# Patient Record
Sex: Male | Born: 1965
Health system: Southern US, Community
[De-identification: ages and names within clinical notes are randomized; demographics above are authoritative.]

## PROBLEM LIST (undated history)

## (undated) DIAGNOSIS — G510 Bell's palsy: Secondary | ICD-10-CM

---

## 2006-10-09 ENCOUNTER — Emergency Department (HOSPITAL_COMMUNITY): Admission: EM | Admit: 2006-10-09 | Discharge: 2006-10-09 | Payer: Self-pay | Admitting: Emergency Medicine

## 2006-12-02 ENCOUNTER — Emergency Department (HOSPITAL_COMMUNITY): Admission: EM | Admit: 2006-12-02 | Discharge: 2006-12-02 | Payer: Self-pay | Admitting: Emergency Medicine

## 2006-12-07 ENCOUNTER — Emergency Department (HOSPITAL_COMMUNITY): Admission: EM | Admit: 2006-12-07 | Discharge: 2006-12-07 | Payer: Self-pay | Admitting: Emergency Medicine

## 2006-12-20 ENCOUNTER — Emergency Department (HOSPITAL_COMMUNITY): Admission: EM | Admit: 2006-12-20 | Discharge: 2006-12-20 | Payer: Self-pay | Admitting: Emergency Medicine
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
## 2008-11-20 ENCOUNTER — Emergency Department (HOSPITAL_COMMUNITY): Admission: EM | Admit: 2008-11-20 | Discharge: 2008-11-20 | Payer: Self-pay | Admitting: Emergency Medicine

## 2010-06-13 LAB — GLUCOSE, CAPILLARY: Glucose-Capillary: 98 mg/dL (ref 70–99)

## 2018-07-05 ENCOUNTER — Encounter (HOSPITAL_COMMUNITY): Payer: Self-pay | Admitting: Emergency Medicine

## 2018-07-05 ENCOUNTER — Emergency Department (HOSPITAL_COMMUNITY)
Admission: EM | Admit: 2018-07-05 | Discharge: 2018-07-05 | Disposition: A | Discharge status: Home / Self Care | Payer: Medicare (Managed Care) | Attending: Emergency Medicine | Admitting: Emergency Medicine

## 2018-07-05 DIAGNOSIS — F17200 Nicotine dependence, unspecified, uncomplicated: Secondary | ICD-10-CM | POA: Diagnosis not present

## 2018-07-05 DIAGNOSIS — I10 Essential (primary) hypertension: Secondary | ICD-10-CM

## 2018-07-05 DIAGNOSIS — G510 Bell's palsy: Secondary | ICD-10-CM

## 2018-07-05 DIAGNOSIS — R29810 Facial weakness: Secondary | ICD-10-CM | POA: Diagnosis present

## 2018-07-05 HISTORY — DX: Bell's palsy: G51.0

## 2018-07-05 MED ORDER — PREDNISONE 20 MG PO TABS: 60.0000 mg | ORAL_TABLET | Freq: Every day | ORAL | 0 refills | Status: AC

## 2018-07-05 MED ORDER — TAMSULOSIN HCL 0.4 MG PO CAPS: 0.4000 mg | ORAL_CAPSULE | Freq: Every day | ORAL | 0 refills | Status: DC

## 2018-07-05 MED ORDER — VALACYCLOVIR HCL 500 MG PO TABS
1000.0000 mg | ORAL_TABLET | Freq: Once | ORAL | Status: AC
  Administered 2018-07-05: 1000 mg via ORAL
  Filled 2018-07-05 (×2): qty 2

## 2018-07-05 MED ORDER — PREDNISONE 20 MG PO TABS
60.0000 mg | ORAL_TABLET | Freq: Once | ORAL | Status: AC
  Administered 2018-07-05: 60 mg via ORAL
  Filled 2018-07-05: qty 3

## 2018-07-05 MED ORDER — TAMSULOSIN HCL 0.4 MG PO CAPS
0.4000 mg | ORAL_CAPSULE | Freq: Once | ORAL | Status: AC
  Administered 2018-07-05: 0.4 mg via ORAL
  Filled 2018-07-05: qty 1

## 2018-07-05 MED ORDER — VALACYCLOVIR HCL 1 G PO TABS: 1000.0000 mg | ORAL_TABLET | Freq: Two times a day (BID) | ORAL | 0 refills | Status: AC

## 2018-07-05 NOTE — ED Provider Notes (Signed)
[facility] EMERGENCY DEPARTMENT Provider Note   CSN: [id number] Arrival date & time: 07/05/18  1445    History   Chief Complaint Chief Complaint  Patient presents with  . Facial Droop    HPI Ronald Dennis is a 53 y.o. male.     HPI Patient presented to the emergency room with a left-sided facial droop that started 2 days ago.  Patient states he has a history of Bell's palsy.  The last time was several years ago.  Couple days ago he noticed that he was developing recurrent symptoms, patient has having difficulty closing his left eye.  He is having difficulty moving left side of his face.  He denies any numbness or weakness in his extremities.  He denies any speech difficulties.  Patient denies any other issues such as headache fevers or chills.  He also requests a refill of his Flomax.  He is supposed to be on that medication but he ran out.  He currently does not have a primary care doctor Past Medical History:  Diagnosis Date  . Bell's palsy     There are no active problems to display for this patient.   History reviewed. No pertinent surgical history.      Home Medications    Prior to Admission medications   Medication Sig Start Date End Date Taking? Authorizing Provider  predniSONE (DELTASONE) 20 MG tablet Take 3 tablets (60 mg total) by mouth daily for 7 days. 07/05/18 07/12/18  Linwood Dibbles, MD  tamsulosin (FLOMAX) 0.4 MG CAPS capsule Take 1 capsule (0.4 mg total) by mouth daily. 07/05/18   Linwood Dibbles, MD  valACYclovir (VALTREX) 1000 MG tablet Take 1 tablet (1,000 mg total) by mouth 2 (two) times daily for 7 days. 07/05/18 07/12/18  Linwood Dibbles, MD    Family History History reviewed. No pertinent family history.  Social History Social History   Tobacco Use  . Smoking status: Current Every Day Smoker  . Smokeless tobacco: Never Used  Substance Use Topics  . Alcohol use: Never    Frequency: Never  . Drug use: Never     Allergies   Patient has  no known allergies.   Review of Systems Review of Systems  All other systems reviewed and are negative.    Physical Exam Updated Vital Signs BP (!) 170/95   Pulse 97   Temp 98.8 F (37.1 C) (Oral)   Resp 18   SpO2 93%   Physical Exam Vitals signs and nursing note reviewed.  Constitutional:      General: He is not in acute distress.    Appearance: He is well-developed.  HENT:     Head: Normocephalic and atraumatic.     Right Ear: External ear normal.     Left Ear: External ear normal.  Eyes:     General: No scleral icterus.       Right eye: No discharge.        Left eye: No discharge.     Conjunctiva/sclera: Conjunctivae normal.  Neck:     Musculoskeletal: Neck supple.     Trachea: No tracheal deviation.  Cardiovascular:     Rate and Rhythm: Normal rate and regular rhythm.  Pulmonary:     Effort: Pulmonary effort is normal. No respiratory distress.     Breath sounds: Normal breath sounds. No stridor. No wheezing or rales.  Abdominal:     General: Bowel sounds are normal. There is no distension.     Palpations: Abdomen is  soft.     Tenderness: There is no abdominal tenderness. There is no guarding or rebound.  Musculoskeletal:        General: No tenderness.  Skin:    General: Skin is warm and dry.     Findings: No rash.  Neurological:     Mental Status: He is alert and oriented to person, place, and time.     Cranial Nerves: Cranial nerve deficit (Left facial droop that involves the forehead, patient is unable to close his left eye, extraocular movements intact, tongue midline  ) present.     Sensory: No sensory deficit.     Motor: No abnormal muscle tone or seizure activity.     Coordination: Coordination normal.     Comments: No pronator drift bilateral upper extrem, able to hold both legs off bed for 5 seconds, sensation intact in all extremities, no visual field cuts, no left or right sided neglect, normal finger-nose exam bilaterally, no nystagmus noted        ED Treatments / Results   Procedures Procedures (including critical care time)  Medications Ordered in ED Medications  valACYclovir (VALTREX) tablet 1,000 mg (has no administration in time range)  tamsulosin (FLOMAX) capsule 0.4 mg (0.4 mg Oral Given 07/05/18 1518)  predniSONE (DELTASONE) tablet 60 mg (60 mg Oral Given 07/05/18 1518)     Initial Impression / Assessment and Plan / ED Course  I have reviewed the triage vital signs and the nursing notes.  Pertinent labs & imaging results that were available during my care of the patient were reviewed by me and considered in my medical decision making (see chart for details).   The patient's exam is consistent with a left-sided facial palsy.  Patient has had this condition before.  He is not having any other symptoms to suggest a central CNS lesion.  Plan on discharge home with steroids and Valtrex.  He also requested a refill of his Flomax.  I encourage patient to follow-up with a primary care doctor and also to have his blood pressure rechecked.  We also discussed eye protection such as using lubricant eyedrops and keeping his eyelid closed at night.  Final Clinical Impressions(s) / ED Diagnoses   Final diagnoses:  Left-sided Bell's palsy  Hypertension, unspecified type    ED Discharge Orders         Ordered    predniSONE (DELTASONE) 20 MG tablet  Daily     07/05/18 1517    valACYclovir (VALTREX) 1000 MG tablet  2 times daily     07/05/18 1517    tamsulosin (FLOMAX) 0.4 MG CAPS capsule  Daily     07/05/18 1517           Linwood DibblesKnapp, Ikeya Brockel, MD 07/05/18 1520

## 2018-07-05 NOTE — ED Notes (Signed)
Pt ambulatory to restroom

## 2018-07-05 NOTE — ED Triage Notes (Signed)
Pt here from home with c/i left sided facial drop times 2 days . Pt has history of Bells Palsy

## 2018-07-05 NOTE — Discharge Instructions (Signed)
Take the medications as prescribed, follow-up with your primary care doctor, to recheck your blood pressure and to make sure your Bell's palsy is improving

## 2019-07-22 ENCOUNTER — Emergency Department (HOSPITAL_COMMUNITY)
Admission: EM | Admit: 2019-07-22 | Discharge: 2019-07-22 | Disposition: A | Discharge status: Home / Self Care | Payer: Medicare HMO | Attending: Emergency Medicine | Admitting: Emergency Medicine

## 2019-07-22 ENCOUNTER — Other Ambulatory Visit: Payer: Self-pay

## 2019-07-22 ENCOUNTER — Encounter (HOSPITAL_COMMUNITY): Payer: Self-pay | Admitting: *Deleted

## 2019-07-22 DIAGNOSIS — N400 Enlarged prostate without lower urinary tract symptoms: Secondary | ICD-10-CM | POA: Diagnosis not present

## 2019-07-22 DIAGNOSIS — F17200 Nicotine dependence, unspecified, uncomplicated: Secondary | ICD-10-CM | POA: Diagnosis not present

## 2019-07-22 DIAGNOSIS — R200 Anesthesia of skin: Secondary | ICD-10-CM | POA: Diagnosis present

## 2019-07-22 DIAGNOSIS — G510 Bell's palsy: Secondary | ICD-10-CM | POA: Diagnosis not present

## 2019-07-22 MED ORDER — METHYLPREDNISOLONE 4 MG PO TBPK: ORAL_TABLET | ORAL | 0 refills | Status: DC

## 2019-07-22 MED ORDER — TAMSULOSIN HCL 0.4 MG PO CAPS: 0.4000 mg | ORAL_CAPSULE | Freq: Every day | ORAL | 0 refills | Status: AC

## 2019-07-22 MED ORDER — PREDNISONE 20 MG PO TABS
40.0000 mg | ORAL_TABLET | Freq: Once | ORAL | Status: AC
  Administered 2019-07-22: 40 mg via ORAL
  Filled 2019-07-22: qty 2

## 2019-07-22 MED ORDER — TAMSULOSIN HCL 0.4 MG PO CAPS
0.4000 mg | ORAL_CAPSULE | Freq: Once | ORAL | Status: AC
  Administered 2019-07-22: 0.4 mg via ORAL
  Filled 2019-07-22: qty 1

## 2019-07-22 NOTE — ED Notes (Signed)
Pt verbalized understanding of discharge instructions, discharged from ED in NAD  °

## 2019-07-22 NOTE — Discharge Instructions (Signed)
You need a primary care provider to refill your prescriptions moving forward.  Please call the [facility] to establish care.

## 2019-07-22 NOTE — ED Triage Notes (Signed)
The pt is here for medicine he wants med for bells palsy he reports that he has had this for 3 times and he wants med for an enlarged prostate  He has a lt facial droop and he cannot close his lt eye he has no other  Numbness or difficulty moving all his extremities

## 2019-07-22 NOTE — ED Provider Notes (Signed)
[place] EMERGENCY DEPARTMENT Provider Note   CSN: [id number] Arrival date & time: 07/22/19  1621     History Chief Complaint  Patient presents with  . wants med for a bells palsy and prostate problem    Ronald Dennis is a 54 y.o. male with a history of Bell's palsy presenting to emergency department with recurrent Bell's palsy.  The patient was seen a year ago for similar symptoms.  Describes onset of right-sided facial numbness and difficulty closing his yesterday.  Has been treated in the past with steroids and Valtrex.  He is requesting steroid treatment again.  There is no fevers or chills.  Has no other acute focal neurological symptoms.  He also reports that he ran out of Flomax and needs this for his enlarged prostate.  HPI     Past Medical History:  Diagnosis Date  . Bell's palsy     There are no problems to display for this patient.   History reviewed. No pertinent surgical history.     No family history on file.  Social History   Tobacco Use  . Smoking status: Current Every Day Smoker  . Smokeless tobacco: Never Used  Substance Use Topics  . Alcohol use: Never  . Drug use: Never    Home Medications Prior to Admission medications   Medication Sig Start Date End Date Taking? Authorizing Provider  methylPREDNISolone (MEDROL DOSEPAK) 4 MG TBPK tablet Use as directed on package 07/22/19   Wyvonnia Dusky, MD  tamsulosin (FLOMAX) 0.4 MG CAPS capsule Take 1 capsule (0.4 mg total) by mouth daily. 07/05/18   Dorie Rank, MD  tamsulosin (FLOMAX) 0.4 MG CAPS capsule Take 1 capsule (0.4 mg total) by mouth daily. 07/22/19 10/20/19  Wyvonnia Dusky, MD    Allergies    Patient has no known allergies.  Review of Systems   Review of Systems  Constitutional: Negative for chills and fever.  Eyes: Negative for pain and visual disturbance.  Respiratory: Negative for cough and shortness of breath.   Cardiovascular: Negative for chest pain and  palpitations.  Gastrointestinal: Negative for abdominal pain and vomiting.  Genitourinary: Negative for dysuria and scrotal swelling.  Skin: Negative for color change and rash.  Neurological: Positive for numbness. Negative for syncope and headaches.  All other systems reviewed and are negative.   Physical Exam Updated Vital Signs BP (!) 147/108 (BP Location: Right Arm)   Pulse 69   Temp 97.9 F (36.6 C) (Oral)   Resp 15   Ht 5\' 6"  (1.676 m)   Wt 68 kg   SpO2 100%   BMI 24.21 kg/m   Physical Exam Vitals and nursing note reviewed.  Constitutional:      Appearance: He is well-developed.  HENT:     Head: Normocephalic and atraumatic.  Eyes:     Conjunctiva/sclera: Conjunctivae normal.     Pupils: Pupils are equal, round, and reactive to light.  Cardiovascular:     Rate and Rhythm: Normal rate and regular rhythm.     Pulses: Normal pulses.  Pulmonary:     Effort: Pulmonary effort is normal. No respiratory distress.  Musculoskeletal:     Cervical back: Neck supple.  Skin:    General: Skin is warm and dry.  Neurological:     Mental Status: He is alert.     GCS: GCS eye subscore is 4. GCS verbal subscore is 5. GCS motor subscore is 6.     Motor: Motor function is intact.  Coordination: Coordination is intact.     Gait: Gait is intact.     Comments: Right sided facial droop involving forehead and extraoccular muscles Unable to close right eye  Psychiatric:        Behavior: Behavior normal.     ED Results / Procedures / Treatments   Labs (all labs ordered are listed, but only abnormal results are displayed) Labs Reviewed - No data to display  EKG None  Radiology No results found.  Procedures Procedures (including critical care time)  Medications Ordered in ED Medications  predniSONE (DELTASONE) tablet 40 mg (40 mg Oral Given 07/22/19 1812)  tamsulosin (FLOMAX) capsule 0.4 mg (0.4 mg Oral Given 07/22/19 1813)    ED Course  I have reviewed the triage  vital signs and the nursing notes.  Pertinent labs & imaging results that were available during my care of the patient were reviewed by me and considered in my medical decision making (see chart for details).  54 yo male here with suspected bell's palsy of the right side of his face Clinically looks this way Highly doubtful of stroke on my exam Also expect this is idiopathetic given he has had this 3 times already.  Doubt he would benefit from antivirals at this time.  No symptoms of lyme's disease.  We can do prednisone again for several days.  Advised lubricating eye drops and gently taping eye shut at night.  Also can refill his flomax     Final Clinical Impression(s) / ED Diagnoses Final diagnoses:  Bell's palsy  Enlarged prostate    Rx / DC Orders ED Discharge Orders         Ordered    methylPREDNISolone (MEDROL DOSEPAK) 4 MG TBPK tablet     07/22/19 1806    tamsulosin (FLOMAX) 0.4 MG CAPS capsule  Daily     07/22/19 1806           Terald Sleeper, MD 07/22/19 2113

## 2019-09-05 ENCOUNTER — Encounter (HOSPITAL_COMMUNITY): Payer: Self-pay

## 2019-09-05 ENCOUNTER — Emergency Department (HOSPITAL_COMMUNITY)
Admission: EM | Admit: 2019-09-05 | Discharge: 2019-09-05 | Disposition: A | Discharge status: Home / Self Care | Payer: Medicare HMO | Attending: Emergency Medicine | Admitting: Emergency Medicine

## 2019-09-05 ENCOUNTER — Emergency Department (HOSPITAL_COMMUNITY): Payer: Medicare HMO

## 2019-09-05 DIAGNOSIS — R079 Chest pain, unspecified: Secondary | ICD-10-CM | POA: Diagnosis not present

## 2019-09-05 DIAGNOSIS — M79602 Pain in left arm: Secondary | ICD-10-CM | POA: Insufficient documentation

## 2019-09-05 DIAGNOSIS — Z5321 Procedure and treatment not carried out due to patient leaving prior to being seen by health care provider: Secondary | ICD-10-CM | POA: Diagnosis not present

## 2019-09-05 DIAGNOSIS — R0789 Other chest pain: Secondary | ICD-10-CM | POA: Insufficient documentation

## 2019-09-05 LAB — TROPONIN I (HIGH SENSITIVITY)
Troponin I (High Sensitivity): 7 ng/L (ref <18)
Troponin I (High Sensitivity): 9 ng/L (ref <18)

## 2019-09-05 LAB — CBC
HCT: 43.7 % (ref 39.0–52.0)
Hemoglobin: 13.4 g/dL (ref 13.0–17.0)
MCH: 28.2 pg (ref 26.0–34.0)
MCHC: 30.7 g/dL (ref 30.0–36.0)
MCV: 92 fL (ref 80.0–100.0)
Platelets: 277 10*3/uL (ref 150–400)
RBC: 4.75 MIL/uL (ref 4.22–5.81)
RDW: 13.3 % (ref 11.5–15.5)
WBC: 10.8 10*3/uL — ABNORMAL HIGH (ref 4.0–10.5)
nRBC: 0 % (ref 0.0–0.2)

## 2019-09-05 LAB — BASIC METABOLIC PANEL
Anion gap: 10 (ref 5–15)
BUN: 7 mg/dL (ref 6–20)
CO2: 23 mmol/L (ref 22–32)
Calcium: 9.5 mg/dL (ref 8.9–10.3)
Chloride: 105 mmol/L (ref 98–111)
Creatinine, Ser: 0.79 mg/dL (ref 0.61–1.24)
GFR calc Af Amer: >60 mL/min (ref >60)
GFR calc non Af Amer: >60 mL/min (ref >60)
Glucose, Bld: 105 mg/dL — ABNORMAL HIGH (ref 70–99)
Potassium: 3.4 mmol/L — ABNORMAL LOW (ref 3.5–5.1)
Sodium: 138 mmol/L (ref 135–145)

## 2019-09-05 MED ORDER — SODIUM CHLORIDE 0.9% FLUSH: 3.0000 mL | Freq: Once | INTRAVENOUS | Status: DC

## 2019-09-05 MED ORDER — IBUPROFEN 400 MG PO TABS
400.0000 mg | ORAL_TABLET | Freq: Once | ORAL | Status: AC | PRN
  Administered 2019-09-05: 400 mg via ORAL
  Filled 2019-09-05: qty 1

## 2019-09-05 NOTE — ED Notes (Signed)
Pt left and refused vitals multiple occasions which lead to him leaving

## 2019-09-05 NOTE — ED Triage Notes (Signed)
Pt comes via [facility] for L arm pain that has been going on for 2 days, pt wants CP evaluation

## 2019-09-05 NOTE — ED Notes (Signed)
Pt requesting something for pain.  

## 2019-09-06 ENCOUNTER — Ambulatory Visit (HOSPITAL_COMMUNITY)
Admission: EM | Admit: 2019-09-06 | Discharge: 2019-09-06 | Disposition: A | Discharge status: Home / Self Care | Payer: Medicare HMO | Attending: Internal Medicine | Admitting: Internal Medicine

## 2019-09-06 ENCOUNTER — Other Ambulatory Visit: Payer: Self-pay

## 2019-09-06 ENCOUNTER — Encounter (HOSPITAL_COMMUNITY): Payer: Self-pay

## 2019-09-06 DIAGNOSIS — M25512 Pain in left shoulder: Secondary | ICD-10-CM | POA: Diagnosis not present

## 2019-09-06 MED ORDER — NAPROXEN 500 MG PO TABS: 500.0000 mg | ORAL_TABLET | Freq: Two times a day (BID) | ORAL | 0 refills | Status: AC | PRN

## 2019-09-06 MED ORDER — KETOROLAC TROMETHAMINE 60 MG/2ML IM SOLN
60.0000 mg | Freq: Once | INTRAMUSCULAR | Status: AC
  Administered 2019-09-06: 60 mg via INTRAMUSCULAR

## 2019-09-06 MED ORDER — KETOROLAC TROMETHAMINE 60 MG/2ML IM SOLN
INTRAMUSCULAR | Status: AC
  Filled 2019-09-06: qty 2

## 2019-09-06 NOTE — ED Provider Notes (Signed)
[facility]    CSN: [id number] Arrival date & time: 09/06/19  1547      History   Chief Complaint Chief Complaint  Patient presents with  . Arm Pain    HPI Ronald Dennis is a 54 y.o. male with past medical history of Bell's palsy seen in ER in April/2020 and again in May 2021 for same presents today with left arm pain.  Patient states symptoms ongoing for 2 days.  Describes pain as constant and worse with any movement.  Denies any injury or trauma to the area.  Patient denies any chest pain, shortness of breath, headache, dizziness, recent fever or chills.    Past Medical History:  Diagnosis Date  . Bell's palsy     There are no problems to display for this patient.   History reviewed. No pertinent surgical history.     Home Medications    Prior to Admission medications   Medication Sig Start Date End Date Taking? Authorizing Provider  methylPREDNISolone (MEDROL DOSEPAK) 4 MG TBPK tablet Use as directed on package 07/22/19   Terald Sleeper, MD  naproxen (NAPROSYN) 500 MG tablet Take 1 tablet (500 mg total) by mouth every 12 (twelve) hours as needed. 09/06/19 09/05/20  Rolla Etienne, NP  tamsulosin (FLOMAX) 0.4 MG CAPS capsule Take 1 capsule (0.4 mg total) by mouth daily. 07/05/18   Linwood Dibbles, MD  tamsulosin (FLOMAX) 0.4 MG CAPS capsule Take 1 capsule (0.4 mg total) by mouth daily. 07/22/19 10/20/19  Terald Sleeper, MD    Family History Family History  Family history unknown: Yes    Social History Social History   Tobacco Use  . Smoking status: Current Every Day Smoker  . Smokeless tobacco: Never Used  Substance Use Topics  . Alcohol use: Never  . Drug use: Never     Allergies   Patient has no known allergies.   Review of Systems As stated in HPI otherwise negative   Physical Exam Triage Vital Signs ED Triage Vitals [09/06/19 1655]  Enc Vitals Group     BP (!) 115/101     Pulse Rate (!) 103     Resp 16     Temp 97.8 F (36.6 C)       Temp src      SpO2 100 %     Weight      Height      Head Circumference      Peak Flow      Pain Score 10     Pain Loc      Pain Edu?      Excl. in GC?    No data found.  Updated Vital Signs BP (!) 115/101   Pulse (!) 103   Temp 97.8 F (36.6 C)   Resp 16   SpO2 100%    Physical Exam Constitutional:      General: He is not in acute distress.    Appearance: Normal appearance.  Musculoskeletal:     Cervical back: Normal range of motion and neck supple. No rigidity or tenderness.     Comments: Able to reproduce pain upon palpation of left suprascapular region.  Normal range of motion though pain on abduction.  No pain upon palpation of joint space.  No crepitus or popping.  Skin:    General: Skin is warm and dry.  Neurological:     General: No focal deficit present.     Mental Status: He is alert and oriented to  person, place, and time.     Motor: No weakness.     Gait: Gait normal.  Psychiatric:     Comments: Slightly erratic in the room.  Difficulty remaining still during exam      UC Treatments / Results  Labs (all labs ordered are listed, but only abnormal results are displayed) Labs Reviewed - No data to display  EKG   Radiology DG Chest 2 View  Result Date: 09/05/2019 CLINICAL DATA:  Chest pain.  Left arm pain. EXAM: CHEST - 2 VIEW COMPARISON:  None. FINDINGS: The cardiomediastinal contours are normal. The lungs are clear. Pulmonary vasculature is normal. No consolidation, pleural effusion, or pneumothorax. No acute osseous abnormalities are seen. IMPRESSION: Negative radiographs of the chest. Electronically Signed   By: Narda Rutherford M.D.   On: 09/05/2019 02:30    Procedures Procedures (including critical care time)  Medications Ordered in UC Medications  ketorolac (TORADOL) injection 60 mg (has no administration in time range)    Initial Impression / Assessment and Plan / UC Course  I have reviewed the triage vital signs and the nursing  notes.  Pertinent labs & imaging results that were available during my care of the patient were reviewed by me and considered in my medical decision making (see chart for details).  Shoulder Pain, left -Unclear etiology though appears to be musculoskeletal in nature.  No known injury or trauma denies any cardiac symptoms -IM Toradol in office -NSAIDs, heat -Follow-up for worsening pain  Final Clinical Impressions(s) / UC Diagnoses   Final diagnoses:  Acute pain of left shoulder     Discharge Instructions     Go to ER for any chest pain or shortness of breath.    ED Prescriptions    Medication Sig Dispense Auth. Provider   naproxen (NAPROSYN) 500 MG tablet Take 1 tablet (500 mg total) by mouth every 12 (twelve) hours as needed. 30 tablet Rolla Etienne, NP     PDMP not reviewed this encounter.   Rolla Etienne, NP 09/07/19 [phone]

## 2019-09-06 NOTE — Discharge Instructions (Addendum)
Go to ER for any chest pain or shortness of breath.

## 2019-09-06 NOTE — ED Triage Notes (Signed)
Pt c/o L shoulder pain x 2 days.  Pt also requesting "a refill of my Bell's Palsy medicine." Symptoms x 3 weeks, states symptoms are not worse but not completely resolved yet.

## 2019-09-08 ENCOUNTER — Encounter (HOSPITAL_COMMUNITY): Payer: Self-pay | Admitting: Emergency Medicine

## 2019-09-08 ENCOUNTER — Other Ambulatory Visit: Payer: Self-pay

## 2019-09-08 ENCOUNTER — Emergency Department (HOSPITAL_COMMUNITY)
Admission: EM | Admit: 2019-09-08 | Discharge: 2019-09-08 | Disposition: A | Discharge status: Home / Self Care | Payer: Medicare HMO | Attending: Emergency Medicine | Admitting: Emergency Medicine

## 2019-09-08 ENCOUNTER — Emergency Department (HOSPITAL_COMMUNITY): Payer: Medicare HMO

## 2019-09-08 DIAGNOSIS — R202 Paresthesia of skin: Secondary | ICD-10-CM | POA: Diagnosis not present

## 2019-09-08 DIAGNOSIS — M25512 Pain in left shoulder: Secondary | ICD-10-CM | POA: Diagnosis not present

## 2019-09-08 DIAGNOSIS — M79602 Pain in left arm: Secondary | ICD-10-CM | POA: Insufficient documentation

## 2019-09-08 DIAGNOSIS — R079 Chest pain, unspecified: Secondary | ICD-10-CM | POA: Diagnosis not present

## 2019-09-08 DIAGNOSIS — R0602 Shortness of breath: Secondary | ICD-10-CM | POA: Insufficient documentation

## 2019-09-08 DIAGNOSIS — R0789 Other chest pain: Secondary | ICD-10-CM | POA: Diagnosis not present

## 2019-09-08 DIAGNOSIS — F17210 Nicotine dependence, cigarettes, uncomplicated: Secondary | ICD-10-CM | POA: Insufficient documentation

## 2019-09-08 LAB — BASIC METABOLIC PANEL
Anion gap: 4 — ABNORMAL LOW (ref 5–15)
BUN: 15 mg/dL (ref 6–20)
CO2: 27 mmol/L (ref 22–32)
Calcium: 9.3 mg/dL (ref 8.9–10.3)
Chloride: 106 mmol/L (ref 98–111)
Creatinine, Ser: 0.89 mg/dL (ref 0.61–1.24)
GFR calc Af Amer: >60 mL/min (ref >60)
GFR calc non Af Amer: >60 mL/min (ref >60)
Glucose, Bld: 108 mg/dL — ABNORMAL HIGH (ref 70–99)
Potassium: 3.7 mmol/L (ref 3.5–5.1)
Sodium: 137 mmol/L (ref 135–145)

## 2019-09-08 LAB — CBC
HCT: 41.6 % (ref 39.0–52.0)
Hemoglobin: 13.3 g/dL (ref 13.0–17.0)
MCH: 29.4 pg (ref 26.0–34.0)
MCHC: 32 g/dL (ref 30.0–36.0)
MCV: 92 fL (ref 80.0–100.0)
Platelets: 260 10*3/uL (ref 150–400)
RBC: 4.52 MIL/uL (ref 4.22–5.81)
RDW: 13.3 % (ref 11.5–15.5)
WBC: 8.2 10*3/uL (ref 4.0–10.5)
nRBC: 0 % (ref 0.0–0.2)

## 2019-09-08 LAB — TROPONIN I (HIGH SENSITIVITY): Troponin I (High Sensitivity): 6 ng/L (ref <18)

## 2019-09-08 MED ORDER — CYCLOBENZAPRINE HCL 10 MG PO TABS: 10.0000 mg | ORAL_TABLET | Freq: Two times a day (BID) | ORAL | 0 refills | Status: AC | PRN

## 2019-09-08 MED ORDER — IBUPROFEN 800 MG PO TABS
800.0000 mg | ORAL_TABLET | Freq: Once | ORAL | Status: AC
  Administered 2019-09-08: 800 mg via ORAL
  Filled 2019-09-08: qty 1

## 2019-09-08 NOTE — ED Provider Notes (Signed)
[facility]-EMERGENCY DEPT Provider Note   CSN: [id number] Arrival date & time: 09/08/19  3716     History Chief Complaint  Patient presents with  . Arm Pain    Ronald Dennis is a 54 y.o. male.  HPI Patient with no significant PMH reports he has had several days of constant, waxing and waning L arm pain, started as an aching pain in shoulder now going down his arm, associated with tingling and now also L upper chest pain and SOB. Denies any particular provoking or relieving factors. He was see at [facility] where he felt to have MSK pain, given a Toradol injection without improvement. He reports he has been taking Motrin and Tylenol at home without improvement but then also requesting a Motrin as it 'helps a little'. Denies any recent injuries. He has a history of chronic Bell's palsy requesting additional steroids as it hasn't gone away since his last ED visit in May. Advised Steroids are not indicated in chronic bell's palsy.     Past Medical History:  Diagnosis Date  . Bell's palsy     There are no problems to display for this patient.   History reviewed. No pertinent surgical history.     Family History  Family history unknown: Yes    Social History   Tobacco Use  . Smoking status: Current Every Day Smoker  . Smokeless tobacco: Never Used  Substance Use Topics  . Alcohol use: Never  . Drug use: Never    Home Medications Prior to Admission medications   Medication Sig Start Date End Date Taking? Authorizing Provider  naproxen (NAPROSYN) 500 MG tablet Take 1 tablet (500 mg total) by mouth every 12 (twelve) hours as needed. 09/06/19 09/05/20 Yes Rolla Etienne, NP  tamsulosin (FLOMAX) 0.4 MG CAPS capsule Take 1 capsule (0.4 mg total) by mouth daily. 07/22/19 10/20/19 Yes Trifan, Kermit Balo, MD  cyclobenzaprine (FLEXERIL) 10 MG tablet Take 1 tablet (10 mg total) by mouth 2 (two) times daily as needed for muscle spasms. 09/08/19   Pollyann Savoy, MD     Allergies    Patient has no known allergies.  Review of Systems   Review of Systems A comprehensive review of systems was completed and negative except as noted in HPI.   Physical Exam Updated Vital Signs BP 127/80 (BP Location: Right Arm)   Pulse 86   Temp 98.1 F (36.7 C) (Oral)   Resp (!) 21   Ht 5\' 6"  (1.676 m)   Wt 65.8 kg   SpO2 96%   BMI 23.40 kg/m   Physical Exam Vitals and nursing note reviewed.  Constitutional:      Appearance: Normal appearance.  HENT:     Head: Normocephalic and atraumatic.     Nose: Nose normal.     Mouth/Throat:     Mouth: Mucous membranes are moist.  Eyes:     Extraocular Movements: Extraocular movements intact.     Conjunctiva/sclera: Conjunctivae normal.  Cardiovascular:     Rate and Rhythm: Normal rate.  Pulmonary:     Effort: Pulmonary effort is normal.     Breath sounds: Normal breath sounds.  Chest:     Chest wall: Tenderness (left upper chest) present.  Abdominal:     General: Abdomen is flat.     Palpations: Abdomen is soft.     Tenderness: There is no abdominal tenderness.  Musculoskeletal:        General: Tenderness (L scapula and trapezius areas) present.  No swelling. Normal range of motion.     Cervical back: Neck supple.  Skin:    General: Skin is warm and dry.  Neurological:     General: No focal deficit present.     Mental Status: He is alert.  Psychiatric:        Mood and Affect: Mood normal.     ED Results / Procedures / Treatments   Labs (all labs ordered are listed, but only abnormal results are displayed) Labs Reviewed  BASIC METABOLIC PANEL - Abnormal; Notable for the following components:      Result Value   Glucose, Bld 108 (*)    Anion gap 4 (*)    All other components within normal limits  CBC  TROPONIN I (HIGH SENSITIVITY)    EKG EKG Interpretation  Date/Time:  Friday September 08 2019 08:53:19 EDT Ventricular Rate:  95 PR Interval:    QRS Duration: 85 QT Interval:  381 QTC  Calculation: 479 R Axis:   68 Text Interpretation: Sinus rhythm Short PR interval Borderline ST elevation, anterior leads Borderline prolonged QT interval Since last tracing Rate slower Confirmed by Susy Frizzle [id number]) on 09/08/2019 8:57:48 AM   Radiology DG Chest 2 View  Result Date: 09/08/2019 CLINICAL DATA:  Chest pain and shortness of breath. EXAM: CHEST - 2 VIEW COMPARISON:  09/05/2019 chest radiograph. FINDINGS: The heart size and mediastinal contours are within normal limits. Both lungs are clear. The visualized skeletal structures are unremarkable. IMPRESSION: No active cardiopulmonary disease. Electronically Signed   By: Stana Bunting M.D.   On: 09/08/2019 09:37    Procedures Procedures (including critical care time)  Medications Ordered in ED Medications  ibuprofen (ADVIL) tablet 800 mg (800 mg Oral Given 09/08/19 0867)    ED Course  I have reviewed the triage vital signs and the nursing notes.  Pertinent labs & imaging results that were available during my care of the patient were reviewed by me and considered in my medical decision making (see chart for details).  Clinical Course as of Sep 07 1001  Fri Sep 08, 2019  6195 EKG without ischemic changes   [CS]  0958 CBC, BMP and Trop are normal. No findings concerning for occult ACS causing his arm discomfort. Will plan d/c with continued NSAIDs, muscle relaxers and PCP followup. Patient confirms he has a PCP although he isn't sure who it is.    [CS]    Clinical Course User Index [CS] Pollyann Savoy, MD   MDM Rules/Calculators/A&P                          Patient with likely MSK pain in LUE but also complaining now of chest pain and SOB. Offered evaluation of those complaints which he has agreed to. Labs, EKG, CXR ordered. Motrin for discomfort. Final Clinical Impression(s) / ED Diagnoses Final diagnoses:  Left arm pain    Rx / DC Orders ED Discharge Orders         Ordered    cyclobenzaprine (FLEXERIL)  10 MG tablet  2 times daily PRN     Discontinue  Reprint     09/08/19 1003           Pollyann Savoy, MD 09/08/19 1003

## 2019-09-08 NOTE — ED Notes (Signed)
Waiting for Social worker to provide cab voucher for patient's departure. Patient stated "I'm disabled and broke, need way to get home".

## 2019-09-08 NOTE — ED Notes (Signed)
Patient stated they found money in pocket so they can take a cab. Will cancel cab voucher.

## 2019-09-08 NOTE — ED Triage Notes (Signed)
Patient c/o LFT arm pain x 3 days.   Patient c/o numbness in fingers and shooting pain going down arm.   Patient was diagnosed with Bells Palsy 3 weeks ago.   Patient requesting continuation of steroid treatment prescribed previously.

## 2020-02-27 ENCOUNTER — Emergency Department (HOSPITAL_COMMUNITY)
Admission: EM | Admit: 2020-02-27 | Discharge: 2020-02-27 | Disposition: A | Discharge status: Home / Self Care | Payer: Medicare HMO | Attending: Emergency Medicine | Admitting: Emergency Medicine

## 2020-02-27 ENCOUNTER — Encounter (HOSPITAL_COMMUNITY): Payer: Self-pay

## 2020-02-27 ENCOUNTER — Other Ambulatory Visit: Payer: Self-pay

## 2020-02-27 DIAGNOSIS — M549 Dorsalgia, unspecified: Secondary | ICD-10-CM | POA: Diagnosis not present

## 2020-02-27 DIAGNOSIS — G8929 Other chronic pain: Secondary | ICD-10-CM | POA: Insufficient documentation

## 2020-02-27 DIAGNOSIS — R1111 Vomiting without nausea: Secondary | ICD-10-CM | POA: Diagnosis not present

## 2020-02-27 DIAGNOSIS — F17200 Nicotine dependence, unspecified, uncomplicated: Secondary | ICD-10-CM | POA: Insufficient documentation

## 2020-02-27 DIAGNOSIS — M545 Low back pain, unspecified: Secondary | ICD-10-CM | POA: Diagnosis not present

## 2020-02-27 DIAGNOSIS — Z760 Encounter for issue of repeat prescription: Secondary | ICD-10-CM | POA: Insufficient documentation

## 2020-02-27 DIAGNOSIS — R000 Tachycardia, unspecified: Secondary | ICD-10-CM | POA: Diagnosis not present

## 2020-02-27 DIAGNOSIS — M5459 Other low back pain: Secondary | ICD-10-CM | POA: Diagnosis not present

## 2020-02-27 DIAGNOSIS — R52 Pain, unspecified: Secondary | ICD-10-CM | POA: Diagnosis not present

## 2020-02-27 DIAGNOSIS — R110 Nausea: Secondary | ICD-10-CM | POA: Diagnosis not present

## 2020-02-27 MED ORDER — OXYCODONE-ACETAMINOPHEN 5-325 MG PO TABS
2.0000 | ORAL_TABLET | Freq: Once | ORAL | Status: AC
  Administered 2020-02-27: 2 via ORAL
  Filled 2020-02-27: qty 2

## 2020-02-27 MED ORDER — TAMSULOSIN HCL 0.4 MG PO CAPS: 0.4000 mg | ORAL_CAPSULE | Freq: Every day | ORAL | 0 refills | Status: AC

## 2020-02-27 MED ORDER — TAMSULOSIN HCL 0.4 MG PO CAPS
0.4000 mg | ORAL_CAPSULE | Freq: Every day | ORAL | 0 refills | Status: DC
Start: 2020-02-27 — End: 2020-02-27
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
## 2020-02-27 NOTE — ED Notes (Signed)
Pt given dc paperwork, a sandwich and a bus pass

## 2020-02-27 NOTE — ED Notes (Signed)
Patient laid himself on the floor stating it hurts to sit that this feels better. This nurse encouraged patient to stay in the chair. Patient got up with no assistance and able to ambulate to the chair.

## 2020-02-27 NOTE — ED Triage Notes (Signed)
Patient arrived via [facility] with complaints of chronic lower back pain. Requesting pain medication refill for percocet.

## 2020-02-27 NOTE — ED Notes (Signed)
Pt continually leaves room to ask for pain medicine and a doctor. Pt told to get back into room so he can be seen by medical staff at the appropriate time

## 2020-03-04 DIAGNOSIS — T50904A Poisoning by unspecified drugs, medicaments and biological substances, undetermined, initial encounter: Secondary | ICD-10-CM | POA: Diagnosis not present

## 2020-03-04 DIAGNOSIS — R4020 Unspecified coma: Secondary | ICD-10-CM | POA: Diagnosis not present

## 2020-03-04 DIAGNOSIS — R410 Disorientation, unspecified: Secondary | ICD-10-CM | POA: Diagnosis not present

## 2020-03-04 DIAGNOSIS — R0902 Hypoxemia: Secondary | ICD-10-CM | POA: Diagnosis not present

## 2020-03-04 DIAGNOSIS — R404 Transient alteration of awareness: Secondary | ICD-10-CM | POA: Diagnosis not present
